# Patient Record
Sex: Female | Born: 1970 | Race: Black or African American | Hispanic: No | Marital: Single | Smoking: Never smoker
Health system: Southern US, Community
[De-identification: ages and names within clinical notes are randomized; demographics above are authoritative.]

## PROBLEM LIST (undated history)

## (undated) DIAGNOSIS — N939 Abnormal uterine and vaginal bleeding, unspecified: Secondary | ICD-10-CM

## (undated) DIAGNOSIS — I10 Essential (primary) hypertension: Secondary | ICD-10-CM

## (undated) DIAGNOSIS — D649 Anemia, unspecified: Secondary | ICD-10-CM

## (undated) DIAGNOSIS — E669 Obesity, unspecified: Secondary | ICD-10-CM

## (undated) HISTORY — DX: Abnormal uterine and vaginal bleeding, unspecified: N93.9

---

## 2009-09-13 ENCOUNTER — Ambulatory Visit: Payer: Self-pay | Admitting: Obstetrics and Gynecology

## 2009-09-13 ENCOUNTER — Encounter (INDEPENDENT_AMBULATORY_CARE_PROVIDER_SITE_OTHER): Payer: Self-pay | Admitting: Family Medicine

## 2009-09-13 ENCOUNTER — Other Ambulatory Visit: Admission: RE | Admit: 2009-09-13 | Discharge: 2009-09-13 | Payer: Self-pay | Admitting: Obstetrics and Gynecology

## 2009-09-13 LAB — CONVERTED CEMR LAB
Chlamydia, DNA Probe: NEGATIVE
TSH: 1.379 microintl units/mL (ref 0.350–4.500)

## 2009-09-21 ENCOUNTER — Ambulatory Visit (HOSPITAL_COMMUNITY): Admission: RE | Admit: 2009-09-21 | Discharge: 2009-09-21 | Payer: Self-pay | Admitting: Obstetrics and Gynecology

## 2009-09-27 ENCOUNTER — Ambulatory Visit: Payer: Self-pay | Admitting: Obstetrics & Gynecology

## 2010-05-04 LAB — POCT PREGNANCY, URINE: Preg Test, Ur: NEGATIVE

## 2013-10-26 ENCOUNTER — Encounter: Payer: Self-pay | Admitting: *Deleted

## 2013-10-26 DIAGNOSIS — N921 Excessive and frequent menstruation with irregular cycle: Secondary | ICD-10-CM

## 2013-11-25 ENCOUNTER — Ambulatory Visit (HOSPITAL_COMMUNITY): Payer: Self-pay

## 2013-12-02 ENCOUNTER — Ambulatory Visit (INDEPENDENT_AMBULATORY_CARE_PROVIDER_SITE_OTHER): Payer: Self-pay | Admitting: Obstetrics and Gynecology

## 2013-12-02 NOTE — Progress Notes (Signed)
Patient here for menorrhagia after being referred from PCP. Patient needed to have pelvic U/S done prior to exam with provider--- missed U/S. U/S rescheduled for 12/07/13 at 0830. Patient to have U/S and then come back for clinic appointment with provider. Not seen today.

## 2013-12-07 ENCOUNTER — Encounter: Payer: Self-pay | Admitting: Obstetrics & Gynecology

## 2013-12-07 ENCOUNTER — Ambulatory Visit (HOSPITAL_COMMUNITY)
Admission: RE | Admit: 2013-12-07 | Discharge: 2013-12-07 | Disposition: A | Discharge status: Home / Self Care | Payer: Self-pay | Source: Ambulatory Visit | Attending: Obstetrics & Gynecology | Admitting: Obstetrics & Gynecology

## 2013-12-07 DIAGNOSIS — N920 Excessive and frequent menstruation with regular cycle: Secondary | ICD-10-CM | POA: Insufficient documentation

## 2013-12-07 DIAGNOSIS — N939 Abnormal uterine and vaginal bleeding, unspecified: Secondary | ICD-10-CM

## 2013-12-07 DIAGNOSIS — N926 Irregular menstruation, unspecified: Secondary | ICD-10-CM | POA: Insufficient documentation

## 2013-12-07 DIAGNOSIS — N921 Excessive and frequent menstruation with irregular cycle: Secondary | ICD-10-CM

## 2013-12-07 HISTORY — DX: Abnormal uterine and vaginal bleeding, unspecified: N93.9

## 2013-12-08 ENCOUNTER — Telehealth: Payer: Self-pay | Admitting: *Deleted

## 2013-12-08 NOTE — Telephone Encounter (Signed)
Attempted to contact patient, no answer, left message for patient to call clinic for results. 

## 2013-12-08 NOTE — Telephone Encounter (Signed)
Message copied by Dorothyann PengHAIZLIP, Naser Schuld E on Thu Dec 08, 2013  9:13 AM ------      Message from: Jaynie CollinsANYANWU, UGONNA A      Created: Wed Dec 07, 2013  8:58 PM       Normal ultrasound. Please call to inform patient of results and recommendations. She can come in to discuss management of AUB with any provider. ------

## 2013-12-08 NOTE — Telephone Encounter (Signed)
Patient returned call. Called patient and informed her of results. Has an appointment 12/4 with provider. Informed patient management of AUB will be discussed at that time. Patient verbalized understanding and gratitude. No questions or concerns.

## 2013-12-16 ENCOUNTER — Encounter: Payer: Self-pay | Admitting: General Practice

## 2014-01-20 ENCOUNTER — Encounter: Payer: Self-pay | Admitting: Obstetrics & Gynecology

## 2016-05-31 ENCOUNTER — Encounter (HOSPITAL_BASED_OUTPATIENT_CLINIC_OR_DEPARTMENT_OTHER): Payer: Self-pay | Admitting: Emergency Medicine

## 2016-05-31 ENCOUNTER — Emergency Department (HOSPITAL_BASED_OUTPATIENT_CLINIC_OR_DEPARTMENT_OTHER): Payer: BLUE CROSS/BLUE SHIELD

## 2016-05-31 ENCOUNTER — Emergency Department (HOSPITAL_BASED_OUTPATIENT_CLINIC_OR_DEPARTMENT_OTHER)
Admission: EM | Admit: 2016-05-31 | Discharge: 2016-05-31 | Disposition: A | Discharge status: Home / Self Care | Payer: BLUE CROSS/BLUE SHIELD | Attending: Emergency Medicine | Admitting: Emergency Medicine

## 2016-05-31 DIAGNOSIS — Z79899 Other long term (current) drug therapy: Secondary | ICD-10-CM | POA: Diagnosis not present

## 2016-05-31 DIAGNOSIS — J40 Bronchitis, not specified as acute or chronic: Secondary | ICD-10-CM | POA: Diagnosis not present

## 2016-05-31 DIAGNOSIS — I10 Essential (primary) hypertension: Secondary | ICD-10-CM | POA: Insufficient documentation

## 2016-05-31 DIAGNOSIS — R05 Cough: Secondary | ICD-10-CM | POA: Diagnosis present

## 2016-05-31 HISTORY — DX: Essential (primary) hypertension: I10

## 2016-05-31 HISTORY — DX: Obesity, unspecified: E66.9

## 2016-05-31 HISTORY — DX: Anemia, unspecified: D64.9

## 2016-05-31 MED ORDER — PREDNISONE 50 MG PO TABS
60.0000 mg | ORAL_TABLET | Freq: Once | ORAL | Status: AC
  Administered 2016-05-31: 60 mg via ORAL
  Filled 2016-05-31: qty 1

## 2016-05-31 MED ORDER — PREDNISONE 10 MG PO TABS: 40.0000 mg | ORAL_TABLET | Freq: Every day | ORAL | 0 refills | Status: AC

## 2016-05-31 MED ORDER — ALBUTEROL SULFATE HFA 108 (90 BASE) MCG/ACT IN AERS: 2.0000 | INHALATION_SPRAY | Freq: Four times a day (QID) | RESPIRATORY_TRACT | 2 refills | Status: AC | PRN

## 2016-05-31 NOTE — Discharge Instructions (Signed)
Chest x-ray negative. Symptoms seem to be consistent with bronchitis. Use albuterol inhaler 2 puffs every 6 hours at least for the next 7 days then as needed. Take the prednisone as directed for the next 5 days. Return for any new or worse symptoms. Make an appointment to follow-up with your doctors.

## 2016-05-31 NOTE — ED Triage Notes (Signed)
Cough x 3 weeks . Took steroids x 2 weeks ago.

## 2016-05-31 NOTE — ED Provider Notes (Signed)
[facility]   CSN: [id number] Arrival date & time: 05/31/16  0800     History   Chief Complaint Chief Complaint  Patient presents with  . Cough    HPI Sarah Irwin is a 46 y.o. female.  Patient with a history of recurrent bronchitis. Patient's been coughing for 3 weeks. Patient has been helped by steroids in the past but has not been on any steroids for the past 2 weeks. Cough is productive but it's clear. No fevers.      Past Medical History:  Diagnosis Date  . Abnormal uterine bleeding (AUB) - likey anovulatory 12/07/2013   Normal pelvic ultrasound on 12/07/13  . Anemia   . Hypertension   . Obesity     Patient Active Problem List   Diagnosis Date Noted  . Abnormal uterine bleeding (AUB) - likey anovulatory 12/07/2013    Past Surgical History:  Procedure Laterality Date  . CESAREAN SECTION     x 3    OB History    No data available       Home Medications    Prior to Admission medications   Medication Sig Start Date End Date Taking? Authorizing Provider  amLODipine (NORVASC) 10 MG tablet Take 10 mg by mouth daily.   Yes Historical Provider, MD  albuterol (PROVENTIL HFA;VENTOLIN HFA) 108 (90 Base) MCG/ACT inhaler Inhale 2 puffs into the lungs every 6 (six) hours as needed for wheezing or shortness of breath. 05/31/16   Vanetta Mulders, MD  predniSONE (DELTASONE) 10 MG tablet Take 4 tablets (40 mg total) by mouth daily. 05/31/16   Vanetta Mulders, MD    Family History No family history on file.  Social History Social History  Substance Use Topics  . Smoking status: Never Smoker  . Smokeless tobacco: Never Used  . Alcohol use No     Allergies   Lisinopril   Review of Systems Review of Systems  Constitutional: Negative for fever.  HENT: Negative for congestion.   Eyes: Negative for redness.  Respiratory: Positive for cough. Negative for shortness of breath.   Cardiovascular: Negative for chest pain.    Gastrointestinal: Negative for abdominal pain.  Genitourinary: Negative for dysuria.  Musculoskeletal: Negative for back pain.  Skin: Negative for rash.  Neurological: Negative for headaches.  Hematological: Does not bruise/bleed easily.  Psychiatric/Behavioral: Negative for confusion.     Physical Exam Updated Vital Signs BP (!) 148/98 (BP Location: Left Arm)   Pulse 86   Temp 98.9 F (37.2 C) (Oral)   Resp 18   Ht  (1.499 m)   Wt 103.9 kg   LMP 05/14/2016   SpO2 100%   BMI 46.25 kg/m   Physical Exam  Constitutional: She is oriented to person, place, and time. She appears well-developed and well-nourished. No distress.  Eyes: Conjunctivae and EOM are normal. Pupils are equal, round, and reactive to light.  Neck: Normal range of motion. Neck supple.  Cardiovascular: Normal rate and normal heart sounds.   Pulmonary/Chest: Effort normal and breath sounds normal. No respiratory distress. She has no wheezes. She has no rales.  Abdominal: Soft. Bowel sounds are normal. There is no tenderness.  Musculoskeletal: Normal range of motion.  Neurological: She is alert and oriented to person, place, and time. No cranial nerve deficit or sensory deficit. She exhibits normal muscle tone. Coordination normal.  Skin: Skin is warm. No rash noted.  Nursing note and vitals reviewed.    ED Treatments / Results  Labs (all labs ordered are listed, but only abnormal results are displayed) Labs Reviewed - No data to display  EKG  EKG Interpretation None       Radiology Dg Chest 2 View  Result Date: 05/31/2016 CLINICAL DATA:  46 year old female with history of cough for 3 weeks. Treated with steroids 2 weeks ago. EXAM: CHEST  2 VIEW COMPARISON:  Chest x-ray 09/05/2015. FINDINGS: Lung volumes are normal. No consolidative airspace disease. No pleural effusions. No evidence of pulmonary edema. Mild cardiomegaly. Upper mediastinal contours are within normal limits. IMPRESSION: 1. No  radiographic evidence of acute cardiopulmonary disease. 2. Mild cardiomegaly is unchanged. Electronically Signed   By: Trudie Reed M.D.   On: 05/31/2016 08:32    Procedures Procedures (including critical care time)  Medications Ordered in ED Medications  predniSONE (DELTASONE) tablet 60 mg (60 mg Oral Given 05/31/16 0934)     Initial Impression / Assessment and Plan / ED Course  I have reviewed the triage vital signs and the nursing notes.  Pertinent labs & imaging results that were available during my care of the patient were reviewed by me and considered in my medical decision making (see chart for details).     Symptoms seem to be consistent with bronchitis. Chest x-ray negative for pneumonia. Will treat with albuterol inhaler and a short course of prednisone.  Final Clinical Impressions(s) / ED Diagnoses   Final diagnoses:  Bronchitis    New Prescriptions New Prescriptions   ALBUTEROL (PROVENTIL HFA;VENTOLIN HFA) 108 (90 BASE) MCG/ACT INHALER    Inhale 2 puffs into the lungs every 6 (six) hours as needed for wheezing or shortness of breath.   PREDNISONE (DELTASONE) 10 MG TABLET    Take 4 tablets (40 mg total) by mouth daily.     Vanetta Mulders, MD 05/31/16 [phone]

## 2018-03-10 IMAGING — DX DG CHEST 2V
2 series · 2 of 2 positions shown · non-contrast
Comparison: Chest x-ray 09/05/2015.

CLINICAL DATA: 46-year-old female with history of cough for 3
weeks. Treated with steroids 2 weeks ago.

EXAM:
CHEST  2 VIEW

[chest pa]
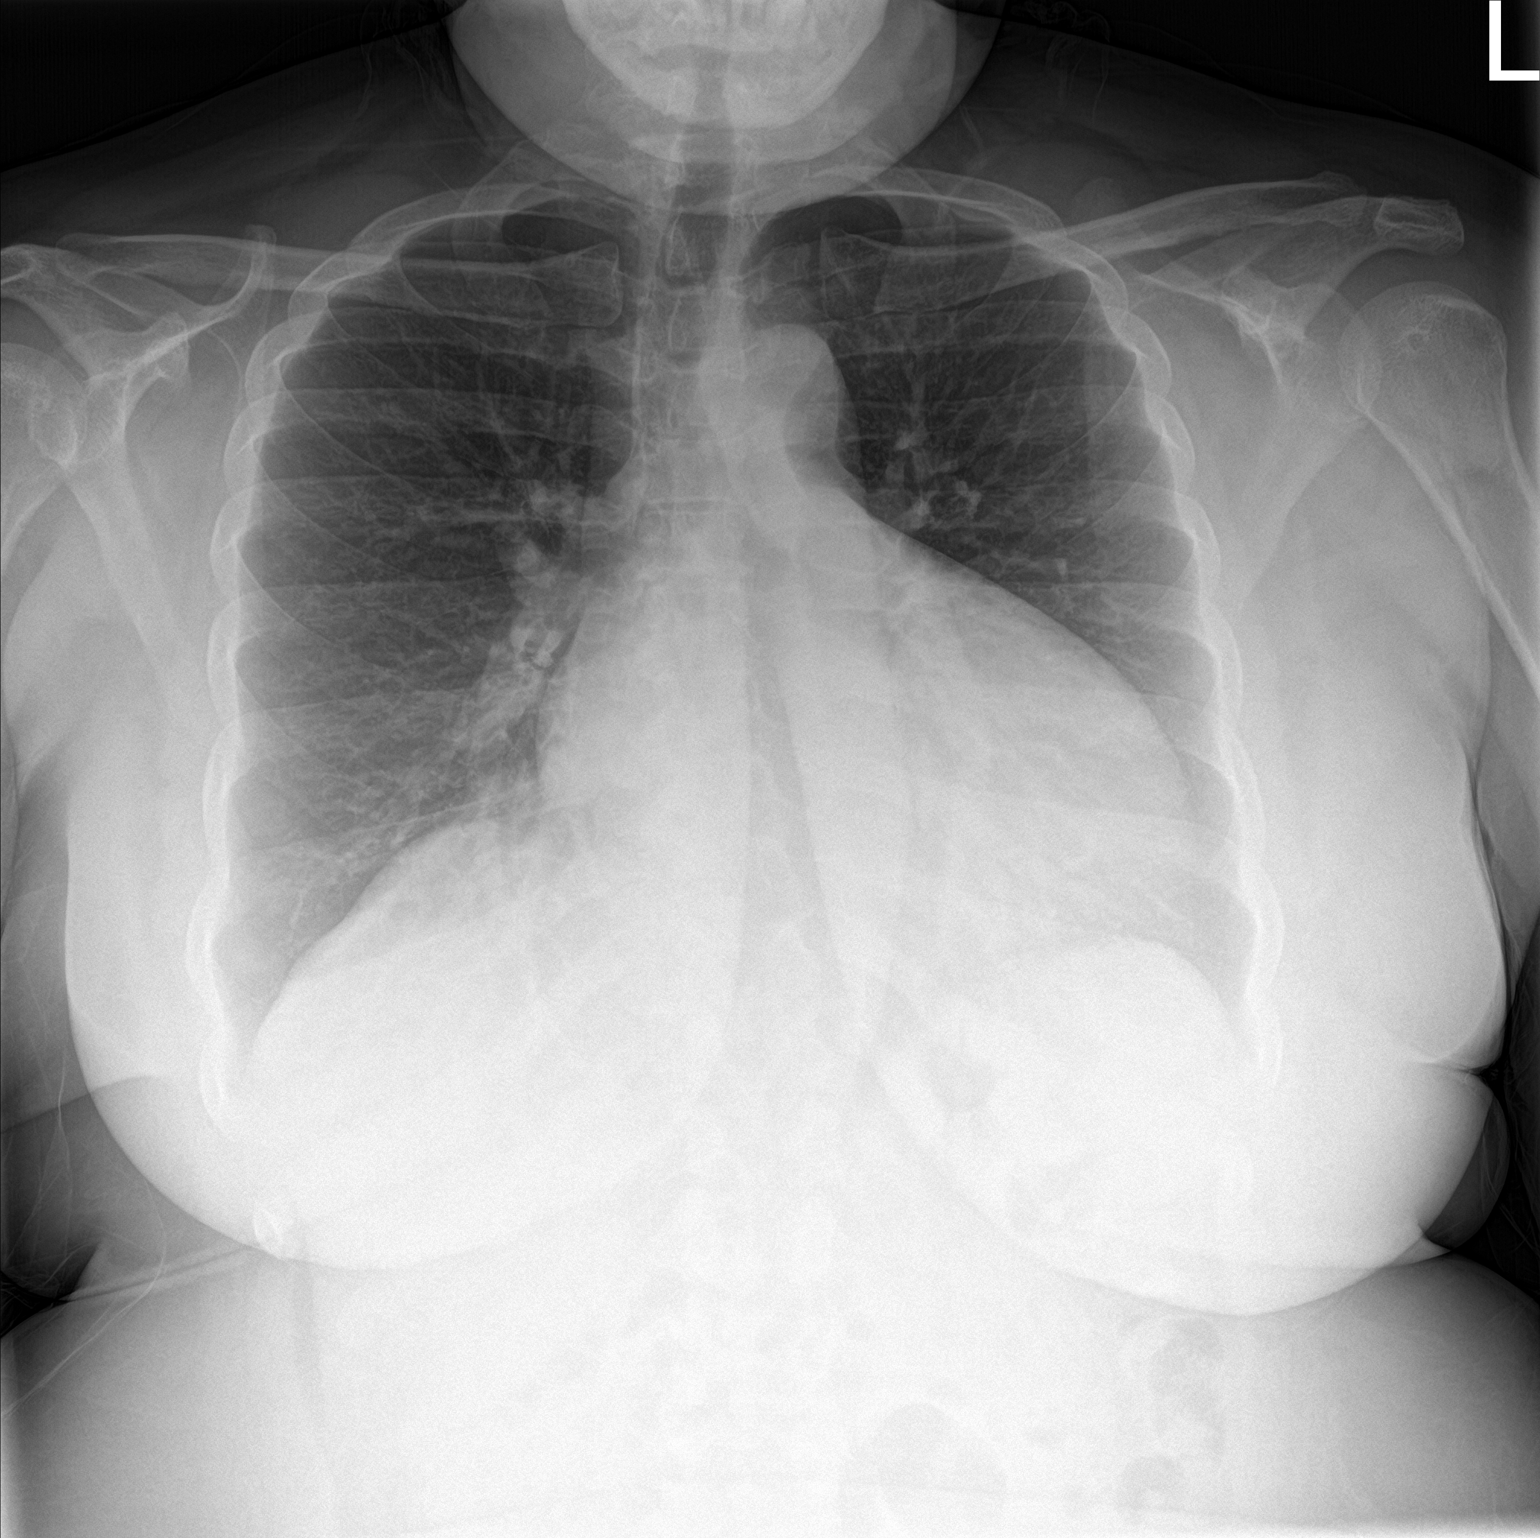

[chest lat]
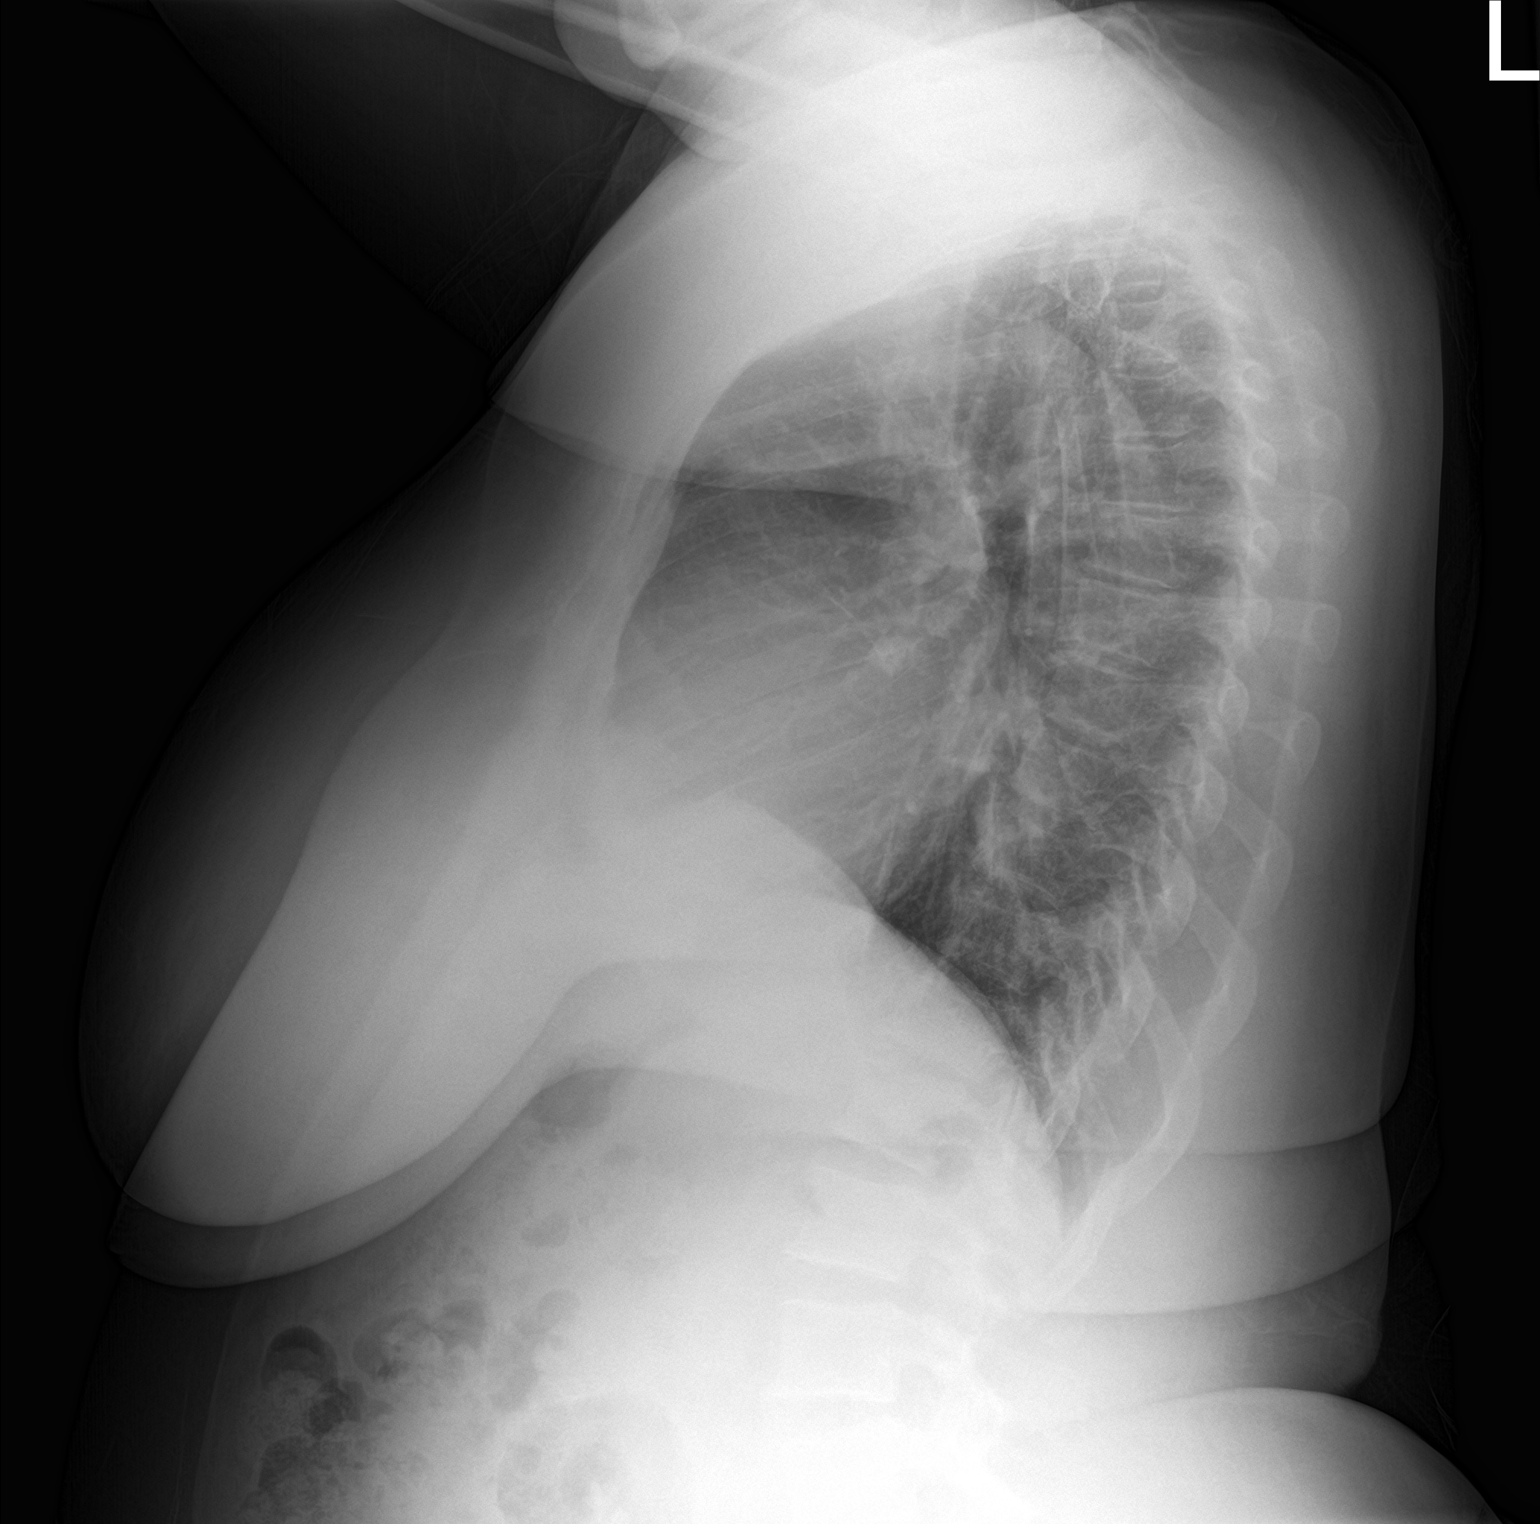

[2 of 2 positions shown; findings below may reference images not displayed]

FINDINGS: Lung volumes are normal. No consolidative airspace disease. No
pleural effusions. No evidence of pulmonary edema. Mild
cardiomegaly. Upper mediastinal contours are within normal limits.
IMPRESSION: 1. No radiographic evidence of acute cardiopulmonary disease.
2. Mild cardiomegaly is unchanged.

## 2018-09-23 ENCOUNTER — Other Ambulatory Visit: Payer: Self-pay

## 2018-09-23 DIAGNOSIS — Z20822 Contact with and (suspected) exposure to covid-19: Secondary | ICD-10-CM

## 2018-09-24 LAB — NOVEL CORONAVIRUS, NAA: SARS-CoV-2, NAA: NOT DETECTED

## 2018-09-27 ENCOUNTER — Telehealth: Payer: Self-pay | Admitting: General Practice

## 2018-09-27 NOTE — Telephone Encounter (Signed)
Patient informed of negative covid result. Patient verbalized understanding.   

## 2020-01-03 ENCOUNTER — Other Ambulatory Visit: Payer: Self-pay | Admitting: Nurse Practitioner

## 2020-01-03 DIAGNOSIS — N644 Mastodynia: Secondary | ICD-10-CM

## 2020-02-03 ENCOUNTER — Other Ambulatory Visit: Payer: Self-pay

## 2020-02-03 ENCOUNTER — Ambulatory Visit
Admission: RE | Admit: 2020-02-03 | Discharge: 2020-02-03 | Disposition: A | Discharge status: Home / Self Care | Payer: 59 | Source: Ambulatory Visit | Attending: Nurse Practitioner | Admitting: Nurse Practitioner

## 2020-02-03 ENCOUNTER — Ambulatory Visit: Payer: BLUE CROSS/BLUE SHIELD

## 2020-02-03 DIAGNOSIS — N644 Mastodynia: Secondary | ICD-10-CM

## 2020-05-16 ENCOUNTER — Other Ambulatory Visit (HOSPITAL_BASED_OUTPATIENT_CLINIC_OR_DEPARTMENT_OTHER): Payer: Self-pay | Admitting: Nurse Practitioner

## 2020-05-16 ENCOUNTER — Other Ambulatory Visit (HOSPITAL_COMMUNITY): Payer: Self-pay | Admitting: Nurse Practitioner

## 2020-05-16 DIAGNOSIS — N959 Unspecified menopausal and perimenopausal disorder: Secondary | ICD-10-CM

## 2020-05-16 DIAGNOSIS — D649 Anemia, unspecified: Secondary | ICD-10-CM

## 2020-05-22 ENCOUNTER — Ambulatory Visit (HOSPITAL_BASED_OUTPATIENT_CLINIC_OR_DEPARTMENT_OTHER)
Admission: RE | Admit: 2020-05-22 | Discharge: 2020-05-22 | Disposition: A | Discharge status: Home / Self Care | Payer: Self-pay | Source: Ambulatory Visit | Attending: Nurse Practitioner | Admitting: Nurse Practitioner

## 2020-05-22 ENCOUNTER — Other Ambulatory Visit: Payer: Self-pay

## 2020-05-22 DIAGNOSIS — N959 Unspecified menopausal and perimenopausal disorder: Secondary | ICD-10-CM | POA: Insufficient documentation

## 2020-05-22 DIAGNOSIS — D649 Anemia, unspecified: Secondary | ICD-10-CM | POA: Insufficient documentation

## 2020-06-05 ENCOUNTER — Encounter: Payer: Self-pay | Admitting: General Practice

## 2021-11-12 IMAGING — MG DIGITAL DIAGNOSTIC BILAT W/ TOMO W/ CAD
8 of 14 series · 8 of 40 positions shown · non-contrast
Comparison: None.

CLINICAL DATA: Patient describes intermittent generalized pain
within the LEFT breast, but no pain today.

EXAM:
DIGITAL DIAGNOSTIC BILATERAL MAMMOGRAM WITH TOMO AND CAD

[R MLO synth-2D]
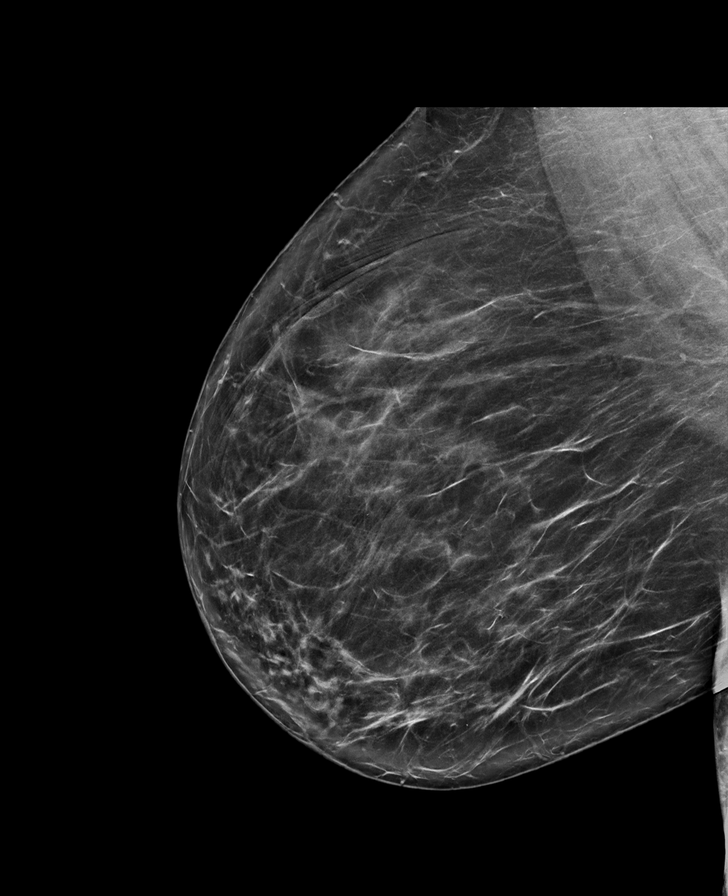

[L XCCL synth-2D]
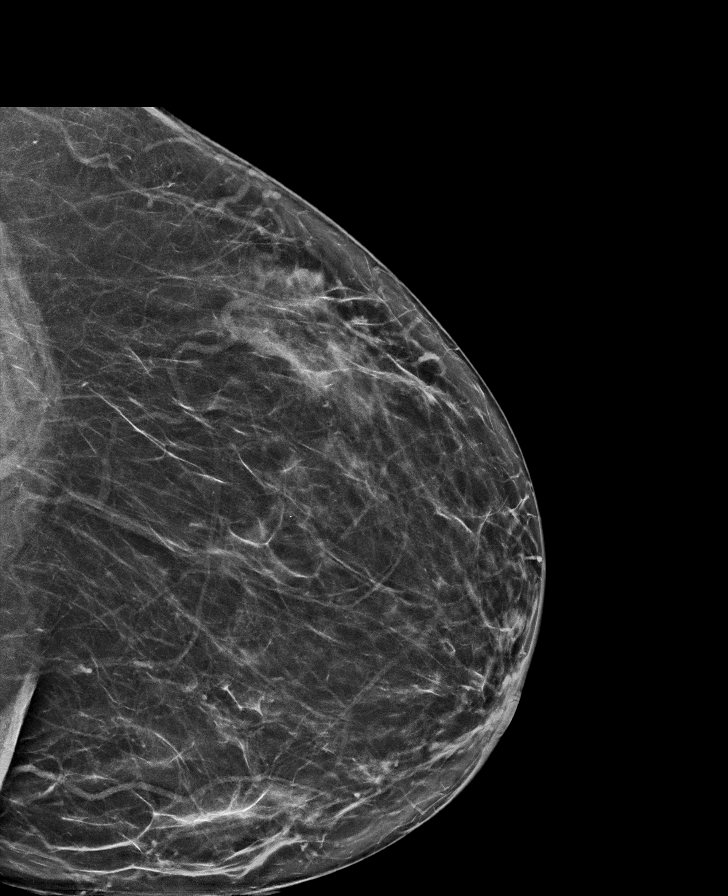

[R CC synth-2D]
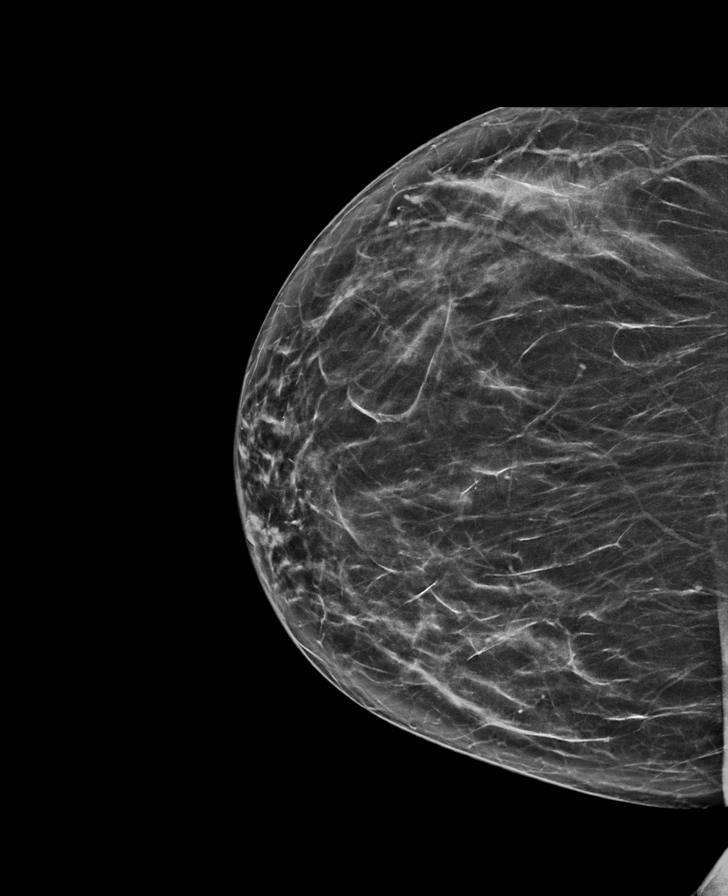

[L CC synth-2D (1 of 3)]
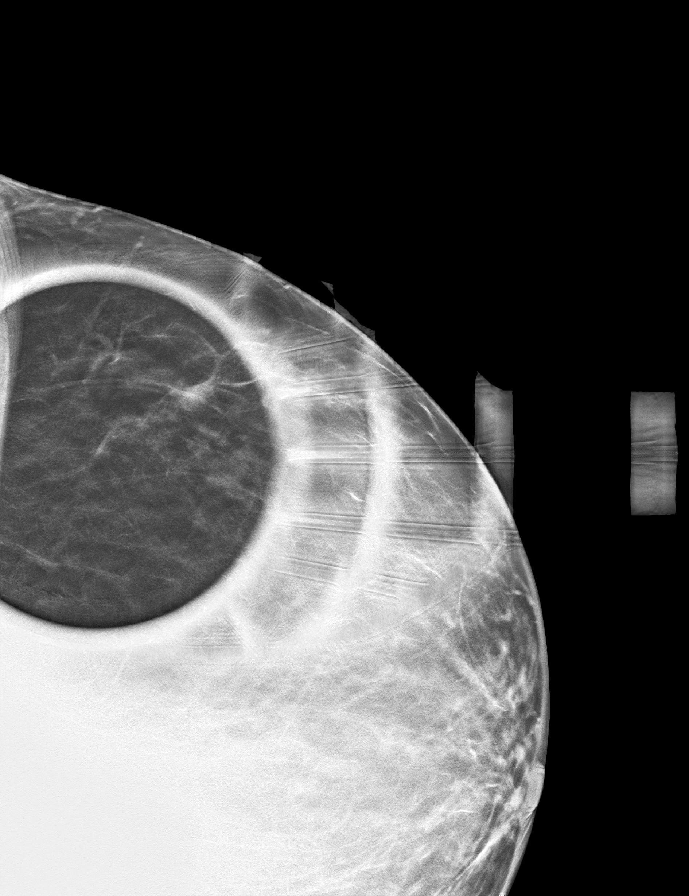

[L CC synth-2D (2 of 3)]
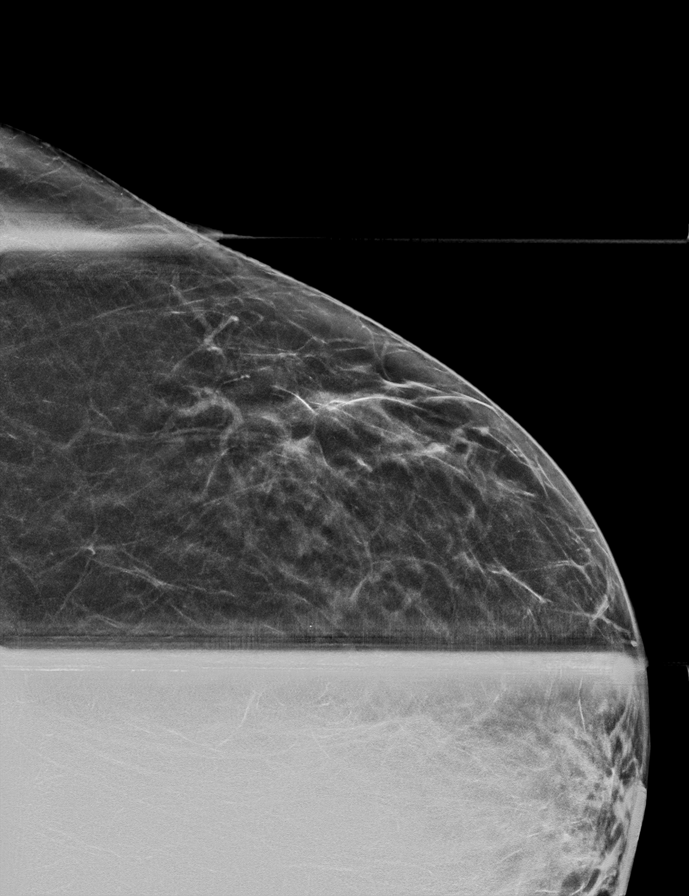

[L CC synth-2D (3 of 3)]
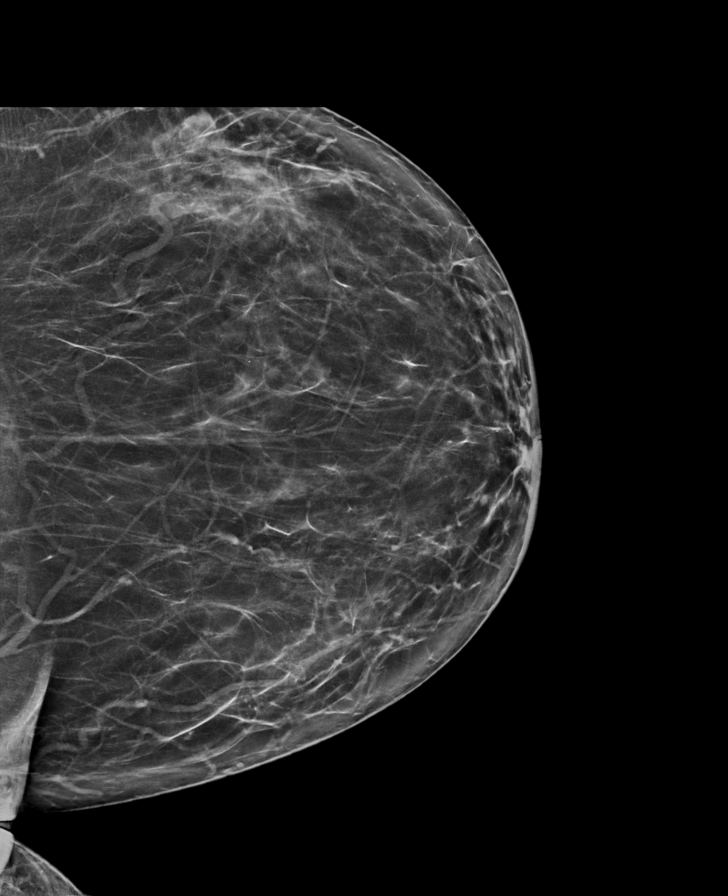

[L MLO synth-2D]
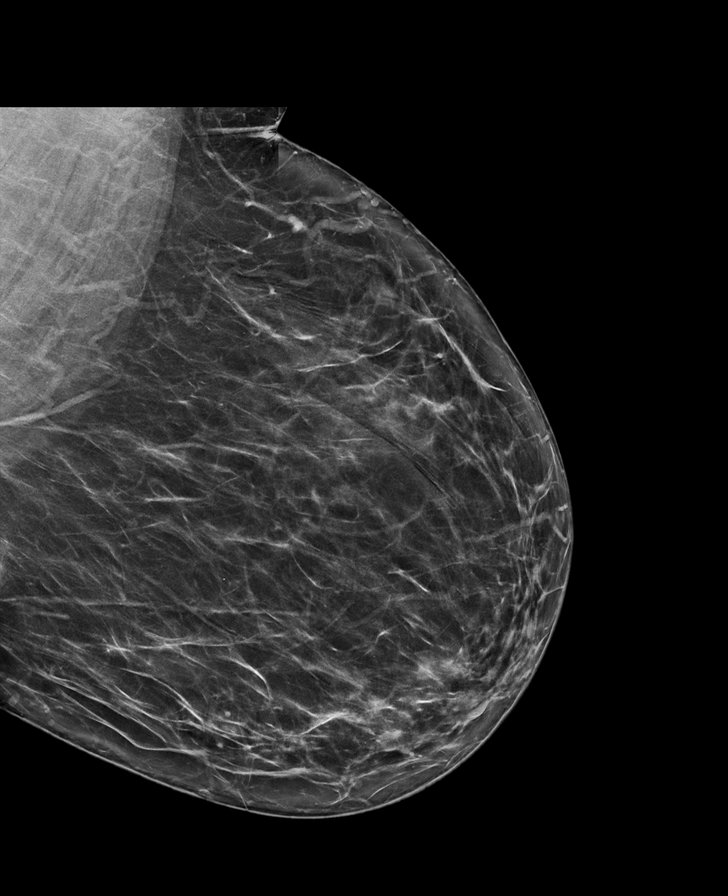

[R CC tomo · tomo slice 35/69.0]
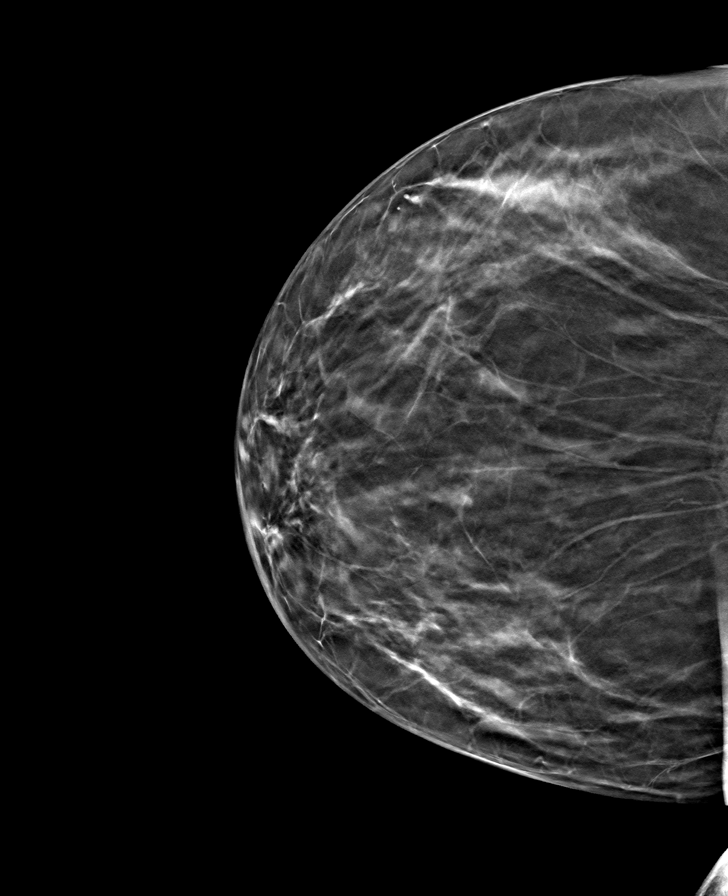

[8 of 40 positions shown; findings below may reference images not displayed]

ACR Breast Density Category b: There are scattered areas of
fibroglandular density.
FINDINGS: There are no dominant masses, suspicious calcifications or secondary
signs of malignancy within either breast.

Mammographic images were processed with CAD.
IMPRESSION: No evidence of malignancy within either breast.

RECOMMENDATION:
1.  Screening mammogram in one year.(Code:J6-C-G4W)
2. Benign causes of breast pain were discussed with the patient.
Patient was encouraged to return for additional imaging if a
palpable lump/mass developed.

I have discussed the findings and recommendations with the patient.
If applicable, a reminder letter will be sent to the patient
regarding the next appointment.

BI-RADS CATEGORY  1: Negative.

## 2022-03-01 IMAGING — US US PELVIS COMPLETE WITH TRANSVAGINAL
1 series · 13 of 25 positions shown · non-contrast
Comparison: Prior ultrasound from 12/07/2013

CLINICAL DATA: Initial evaluation for anemia, postmenopausal
bleeding.



[Series 1: us pelvis complete with transvaginal · 13 of 84 slices shown]
[im 1/84]
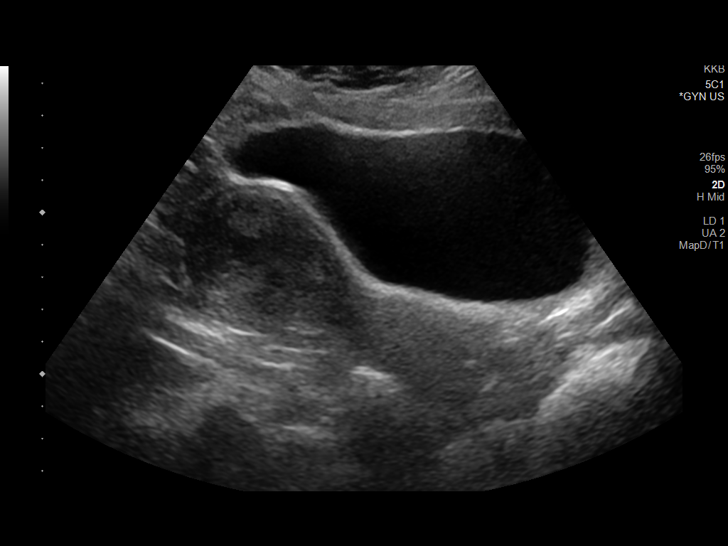
[im 7/84]
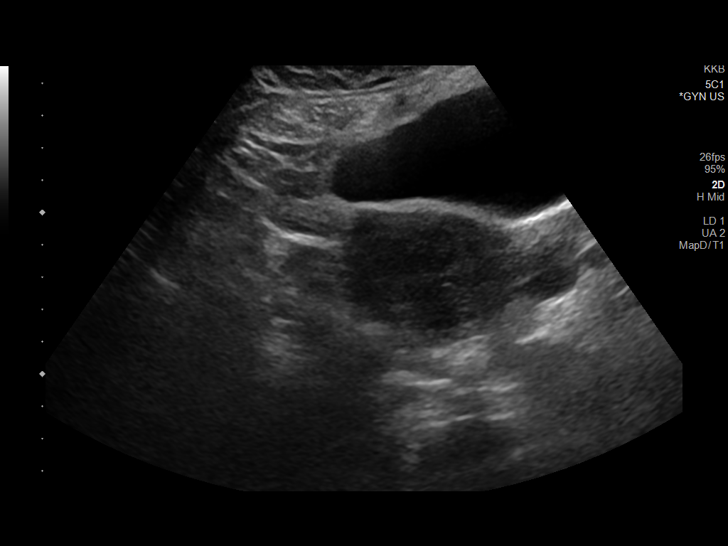
[im 14/84]
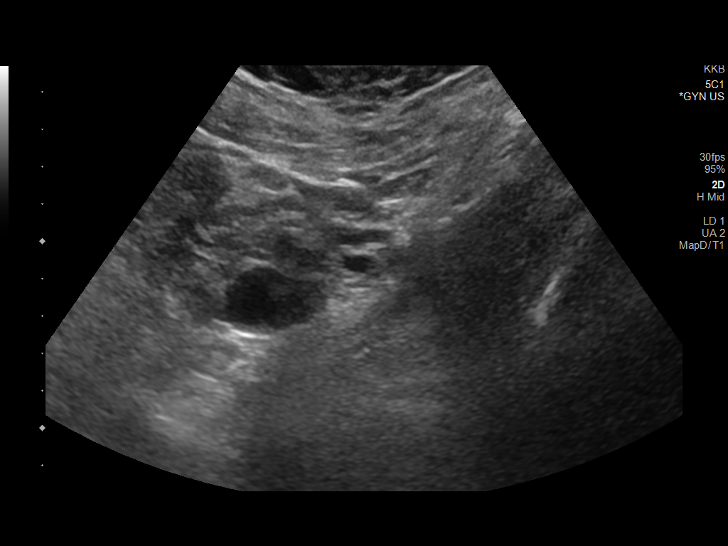
[im 21/84]
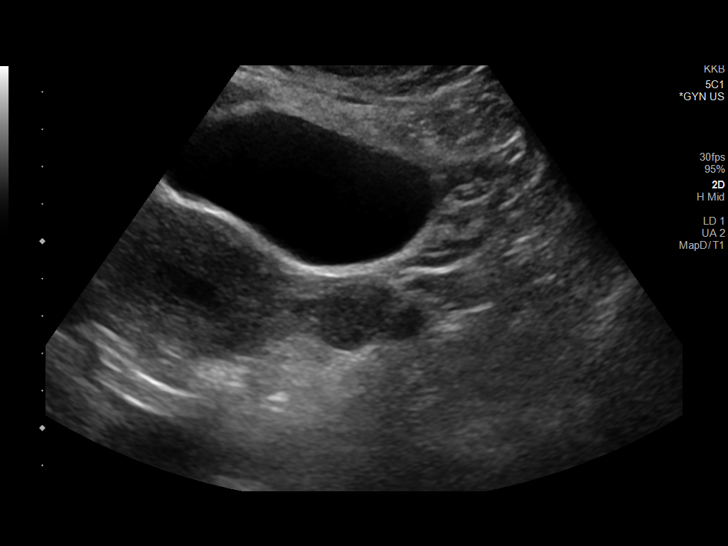
[im 28/84]
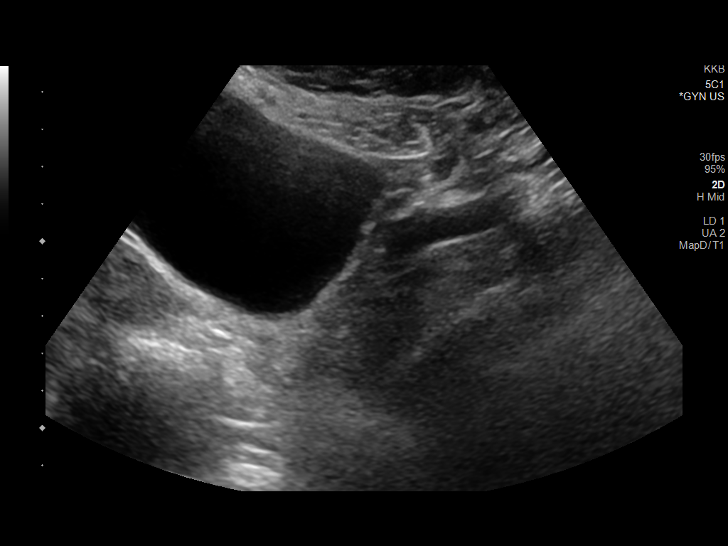
[im 35/84]
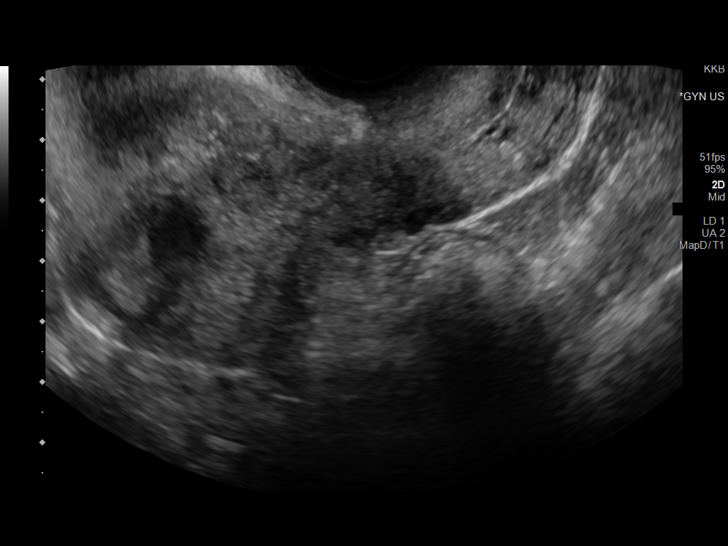
[im 42/84]
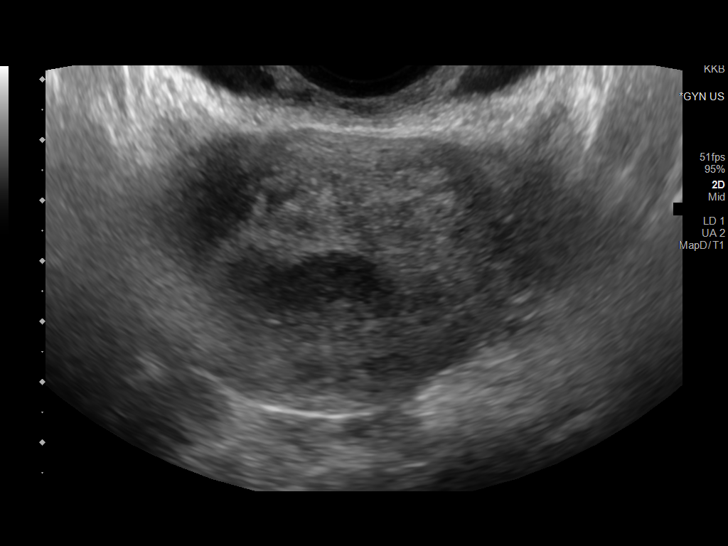
[im 49/84]
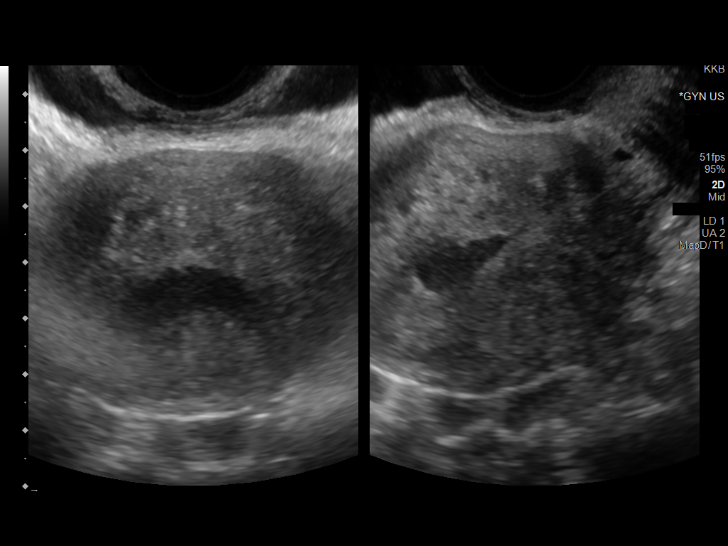
[im 56/84]
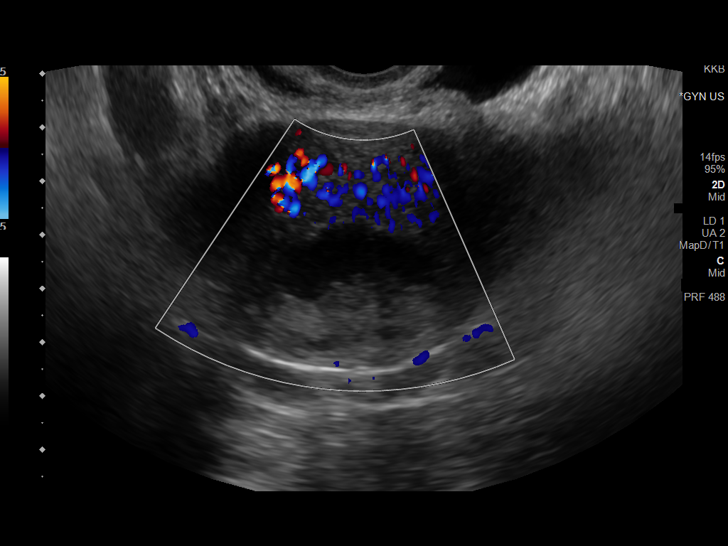
[im 63/84]
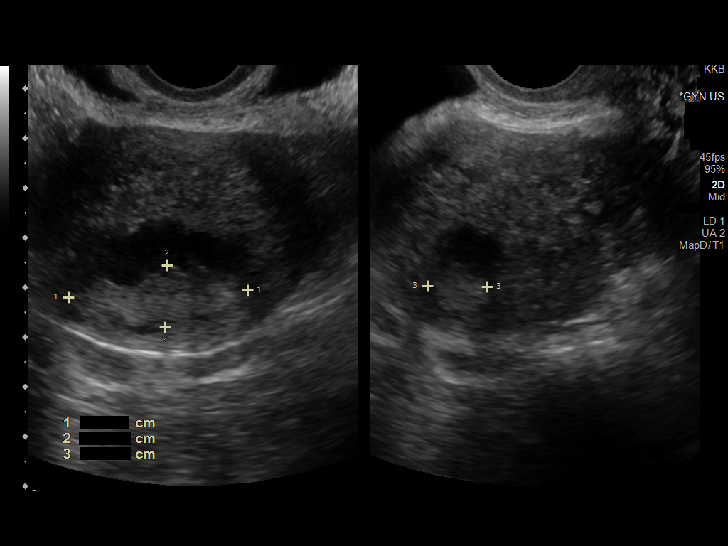
[im 70/84]
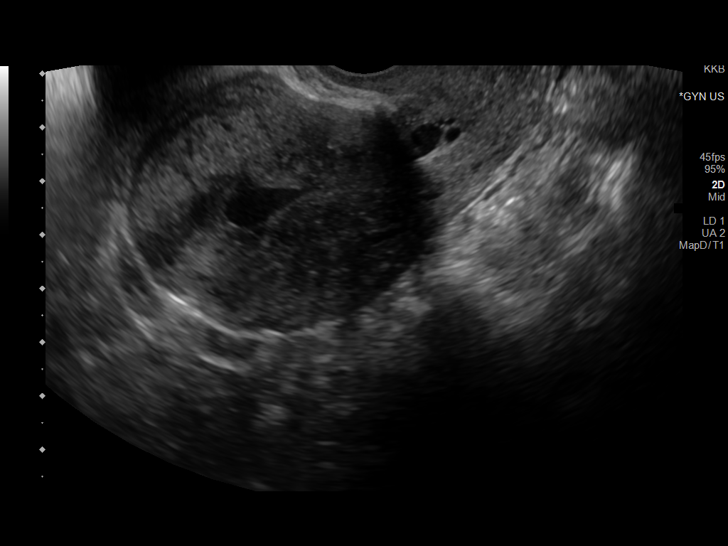
[im 77/84]
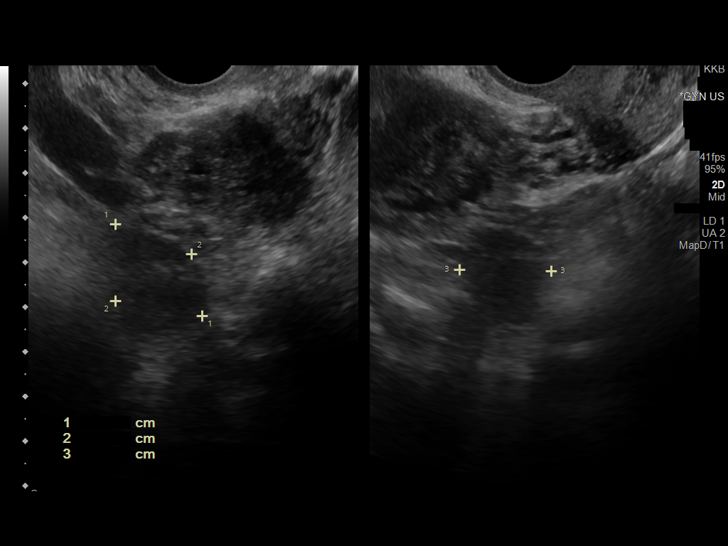
[im 84/84]
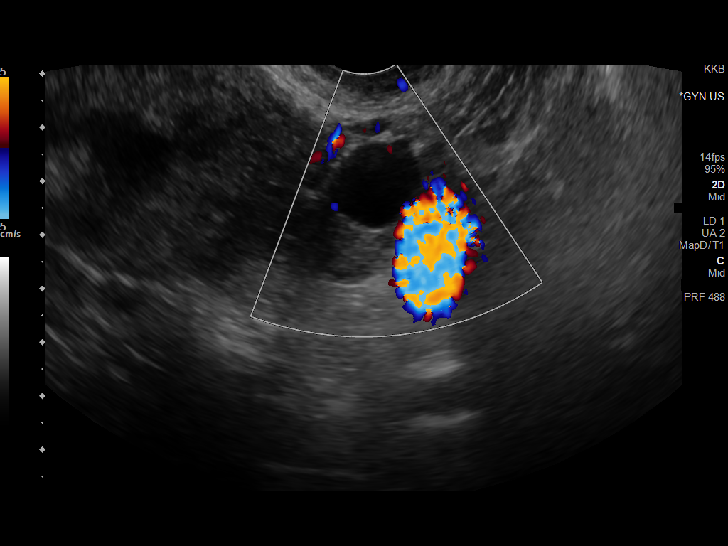

[13 of 25 positions shown; findings below may reference images not displayed]

FINDINGS: Uterus

Measurements: 9.9 x 4.9 x 6.4 cm = volume: 161.7 mL. Uterus is
anteverted. 1.2 x 1.1 x 1.0 cm hypoechoic intramural fibroid present
at the anterior right uterine body.

Endometrium

Thickness: Approximately 6.3 mm. Endometrial cavity distended with
minimally complex anechoic fluid measuring 2.4 x 0.9 x 1.8 cm. There
is question polypoid echogenic lesion protruding into this fluid
within the endometrial cavity, measuring 3.6 x 1.2 x 1.2 cm (image
64). Finding raises the possibility for a focal endometrial
lesion/mass. No appreciable associated vascularity.

Right ovary

Measurements: 3.4 x 1.7 x 2.8 cm = volume: 8.4 mL. Normal
appearance/no adnexal mass.

Left ovary

Measurements: 3.0 x 1.8 x 2.1 cm = volume: 5.9 mL. Normal
appearance/no adnexal mass. 1.8 cm dominant follicle noted.

Other findings

No abnormal free fluid.
IMPRESSION: 1. Thickened endometrial complex with mildly complex fluid
distending the endometrial cavity. Question additional superimposed
3.6 x 1.2 x 1.2 cm polypoid echogenic lesion protruding into the
endometrial cavity as above. In the setting of post-menopausal
bleeding, endometrial sampling is indicated to exclude carcinoma. If
results are benign, sonohysterogram should be considered for focal
lesion work-up prior to hysteroscopy. (Ref: Radiological Reasoning:
Algorithmic Workup of Abnormal Vaginal Bleeding with Endovaginal
Sonography and Sonohysterography. AJR [id number]; 191:S68-73)
2. 1.2 cm intramural fibroid at the anterior right uterine body.
3. Normal sonographic appearance of the ovaries.

These results will be called to the ordering clinician or
representative by the Radiologist Assistant, and communication
documented in the PACS or [REDACTED].
